# Patient Record
Sex: Female | Born: 1980 | Race: Black or African American | Hispanic: No | Marital: Single | Smoking: Never smoker
Health system: Southern US, Community
[De-identification: ages and names within clinical notes are randomized; demographics above are authoritative.]

---

## 2018-02-05 ENCOUNTER — Encounter (HOSPITAL_BASED_OUTPATIENT_CLINIC_OR_DEPARTMENT_OTHER): Payer: Self-pay | Admitting: *Deleted

## 2018-02-05 ENCOUNTER — Emergency Department (HOSPITAL_BASED_OUTPATIENT_CLINIC_OR_DEPARTMENT_OTHER): Payer: BLUE CROSS/BLUE SHIELD

## 2018-02-05 ENCOUNTER — Emergency Department (HOSPITAL_BASED_OUTPATIENT_CLINIC_OR_DEPARTMENT_OTHER)
Admission: EM | Admit: 2018-02-05 | Discharge: 2018-02-05 | Disposition: A | Discharge status: Home / Self Care | Payer: BLUE CROSS/BLUE SHIELD | Attending: Emergency Medicine | Admitting: Emergency Medicine

## 2018-02-05 ENCOUNTER — Other Ambulatory Visit: Payer: Self-pay

## 2018-02-05 DIAGNOSIS — N83291 Other ovarian cyst, right side: Secondary | ICD-10-CM | POA: Diagnosis not present

## 2018-02-05 DIAGNOSIS — N83209 Unspecified ovarian cyst, unspecified side: Secondary | ICD-10-CM

## 2018-02-05 DIAGNOSIS — R1030 Lower abdominal pain, unspecified: Secondary | ICD-10-CM | POA: Diagnosis present

## 2018-02-05 LAB — CBC WITH DIFFERENTIAL/PLATELET
ABS IMMATURE GRANULOCYTES: 0.01 10*3/uL (ref 0.00–0.07)
BASOS ABS: 0 10*3/uL (ref 0.0–0.1)
BASOS PCT: 0 %
Eosinophils Absolute: 0 10*3/uL (ref 0.0–0.5)
Eosinophils Relative: 1 %
HCT: 38 % (ref 36.0–46.0)
HEMOGLOBIN: 12.5 g/dL (ref 12.0–15.0)
Immature Granulocytes: 0 %
LYMPHS PCT: 31 %
Lymphs Abs: 1.5 10*3/uL (ref 0.7–4.0)
MCH: 30.6 pg (ref 26.0–34.0)
MCHC: 32.9 g/dL (ref 30.0–36.0)
MCV: 93.1 fL (ref 80.0–100.0)
MONO ABS: 0.3 10*3/uL (ref 0.1–1.0)
Monocytes Relative: 6 %
NEUTROS ABS: 2.9 10*3/uL (ref 1.7–7.7)
NRBC: 0 % (ref 0.0–0.2)
Neutrophils Relative %: 62 %
PLATELETS: 254 10*3/uL (ref 150–400)
RBC: 4.08 MIL/uL (ref 3.87–5.11)
RDW: 12.4 % (ref 11.5–15.5)
WBC: 4.8 10*3/uL (ref 4.0–10.5)

## 2018-02-05 LAB — COMPREHENSIVE METABOLIC PANEL
ALBUMIN: 4.2 g/dL (ref 3.5–5.0)
ALK PHOS: 66 U/L (ref 38–126)
ALT: 21 U/L (ref 0–44)
ANION GAP: 9 (ref 5–15)
AST: 20 U/L (ref 15–41)
BUN: 10 mg/dL (ref 6–20)
CALCIUM: 9 mg/dL (ref 8.9–10.3)
CHLORIDE: 102 mmol/L (ref 98–111)
CO2: 26 mmol/L (ref 22–32)
Creatinine, Ser: 0.67 mg/dL (ref 0.44–1.00)
GFR calc non Af Amer: >60 mL/min (ref >60)
GLUCOSE: 93 mg/dL (ref 70–99)
Potassium: 4 mmol/L (ref 3.5–5.1)
SODIUM: 137 mmol/L (ref 135–145)
Total Bilirubin: 0.9 mg/dL (ref 0.3–1.2)
Total Protein: 7.7 g/dL (ref 6.5–8.1)

## 2018-02-05 LAB — URINALYSIS, ROUTINE W REFLEX MICROSCOPIC
BILIRUBIN URINE: NEGATIVE
GLUCOSE, UA: NEGATIVE mg/dL
HGB URINE DIPSTICK: NEGATIVE
KETONES UR: NEGATIVE mg/dL
Nitrite: NEGATIVE
PH: 5.5 (ref 5.0–8.0)
Protein, ur: NEGATIVE mg/dL

## 2018-02-05 LAB — URINALYSIS, MICROSCOPIC (REFLEX)

## 2018-02-05 LAB — PREGNANCY, URINE: Preg Test, Ur: NEGATIVE

## 2018-02-05 MED ORDER — ONDANSETRON HCL 4 MG PO TABS: 4.0000 mg | ORAL_TABLET | Freq: Four times a day (QID) | ORAL | 0 refills | Status: AC

## 2018-02-05 MED ORDER — OXYCODONE-ACETAMINOPHEN 5-325 MG PO TABS: 1.0000 | ORAL_TABLET | ORAL | 0 refills | Status: AC | PRN

## 2018-02-05 MED ORDER — IOPAMIDOL (ISOVUE-300) INJECTION 61%
100.0000 mL | Freq: Once | INTRAVENOUS | Status: AC | PRN
  Administered 2018-02-05: 100 mL via INTRAVENOUS

## 2018-02-05 MED ORDER — MORPHINE SULFATE (PF) 4 MG/ML IV SOLN
4.0000 mg | Freq: Once | INTRAVENOUS | Status: AC
  Administered 2018-02-05: 4 mg via INTRAVENOUS
  Filled 2018-02-05: qty 1

## 2018-02-05 MED ORDER — IOPAMIDOL (ISOVUE-300) INJECTION 61%
30.0000 mL | Freq: Once | INTRAVENOUS | Status: AC | PRN
  Administered 2018-02-05: 15 mL via ORAL

## 2018-02-05 NOTE — ED Provider Notes (Signed)
[facility] EMERGENCY DEPARTMENT Provider Note   CSN: [id number] Arrival date & time: 02/05/18  4098     History   Chief Complaint Chief Complaint  Patient presents with  . Abdominal Pain    HPI Kara Garrett is a 37 y.o. female.  The history is provided by the patient and medical records.  Abdominal Pain   This is a new problem. The current episode started more than 2 days ago. The problem occurs daily. The problem has been gradually improving. The pain is associated with an unknown factor. The pain is located in the RLQ, LLQ, rectum and suprapubic region. The quality of the pain is sharp and aching. The pain is severe. Pertinent negatives include fever, belching, diarrhea, melena, nausea, vomiting, constipation and dysuria. The symptoms are aggravated by palpation.    History reviewed. No pertinent past medical history.  There are no active problems to display for this patient.   History reviewed. No pertinent surgical history.   OB History   None      Home Medications    Prior to Admission medications   Not on File    Family History History reviewed. No pertinent family history.  Social History Social History   Tobacco Use  . Smoking status: Never Smoker  . Smokeless tobacco: Never Used  Substance Use Topics  . Alcohol use: Not on file  . Drug use: Not on file     Allergies   Patient has no known allergies.   Review of Systems Review of Systems  Constitutional: Negative for chills, diaphoresis, fatigue and fever.  HENT: Negative for congestion.   Respiratory: Negative for cough, chest tightness and shortness of breath.   Gastrointestinal: Positive for abdominal pain and rectal pain. Negative for abdominal distention, constipation, diarrhea, melena, nausea and vomiting.  Genitourinary: Negative for decreased urine volume, dysuria, flank pain, pelvic pain, vaginal bleeding, vaginal discharge and vaginal pain.  Musculoskeletal:  Negative for back pain, neck pain and neck stiffness.  Skin: Negative for rash and wound.  Psychiatric/Behavioral: Negative for agitation.     Physical Exam Updated Vital Signs BP (!) 108/53 (BP Location: Left Arm)   Pulse 72   Temp 98.5 F (36.9 C) (Oral)   Resp 18   LMP 11/05/2017   SpO2 100%   Physical Exam  Constitutional: She appears well-developed and well-nourished.  Non-toxic appearance. She does not appear ill. No distress.  HENT:  Head: Normocephalic and atraumatic.  Eyes: Conjunctivae are normal.  Neck: Neck supple.  Cardiovascular: Normal rate and regular rhythm.  No murmur heard. Pulmonary/Chest: Effort normal and breath sounds normal. No respiratory distress.  Abdominal: Soft. Normal appearance and bowel sounds are normal. There is tenderness in the right lower quadrant, suprapubic area and left lower quadrant. There is no rigidity, no rebound, no guarding and no CVA tenderness.  Genitourinary: Rectal exam shows no mass and no tenderness.  Genitourinary Comments: Refused pelvic exam  Musculoskeletal: She exhibits no edema.  Neurological: She is alert.  Skin: Skin is warm and dry. Capillary refill takes less than 2 seconds. She is not diaphoretic.  Psychiatric: She has a normal mood and affect.  Nursing note and vitals reviewed.    ED Treatments / Results  Labs (all labs ordered are listed, but only abnormal results are displayed) Labs Reviewed  URINALYSIS, ROUTINE W REFLEX MICROSCOPIC - Abnormal; Notable for the following components:      Result Value   Specific Gravity, Urine >1.030 (*)    Leukocytes,  UA TRACE (*)    All other components within normal limits  URINALYSIS, MICROSCOPIC (REFLEX) - Abnormal; Notable for the following components:   Bacteria, UA FEW (*)    All other components within normal limits  PREGNANCY, URINE  CBC WITH DIFFERENTIAL/PLATELET  COMPREHENSIVE METABOLIC PANEL    EKG None  Radiology Ct Abdomen Pelvis W  Contrast  Result Date: 02/05/2018 CLINICAL DATA:  Lower abdomen pain, particularly within the right lower quadrant, also some rectal pain EXAM: CT ABDOMEN AND PELVIS WITH CONTRAST TECHNIQUE: Multidetector CT imaging of the abdomen and pelvis was performed using the standard protocol following bolus administration of intravenous contrast. CONTRAST:  ISOVUE-300 IOPAMIDOL (ISOVUE-300) INJECTION 61%, 15mL ISOVUE-300 IOPAMIDOL (ISOVUE-300) INJECTION 61% COMPARISON:  None. FINDINGS: Lower chest: The lung bases are clear. The heart is within normal limits in size. Hepatobiliary: The liver enhances with no focal abnormality and no ductal dilatation is seen. The gallbladder is visualized and no calcified gallstones are seen. Pancreas: The pancreas is normal in size and the pancreatic duct is not dilated. Spleen: The spleen is unremarkable. Adrenals/Urinary Tract: The adrenal glands appear normal. The kidneys enhance with no abnormality and no renal calculi are seen. No hydronephrosis is noted. The ureters appear normal in caliber. The urinary bladder is moderately well distended with no abnormality evident. Stomach/Bowel: The stomach is distended with oral contrast and retained food debris. No small bowel distention is seen and no edema is noted. There is a moderate amount of feces throughout the entire colon. No distention of the colon is seen. The terminal ileum and the appendix are well visualized with no inflammatory process noted. Vascular/Lymphatic: The abdominal aorta is normal in caliber. No adenopathy is seen. Reproductive: The uterus is normal in size. However there appear to be ovarian cysts bilaterally the largest on the left posteriorly adjacent to the rectum measuring 3.6 x 2.7 cm with some peripheral enhancement. Also there is a small amount of free fluid within the pelvis. This may indicate a recently ruptured ovarian cyst. Prominent adnexal vascularity also is present of questionable significance  but which can be seen with pelvic congestion syndrome. Other: No abdominal wall hernia is seen. Musculoskeletal: The lumbar vertebrae are in normal alignment with normal intervertebral disc spaces. The SI joints appear well corticated. IMPRESSION: 1. There are ovarian cysts left larger than right with the left ovarian cyst posteriorly positioned adjacent to the rectum with some peripheral enhancement and a small amount of free fluid. This could indicate recent rupture of an ovarian cyst. 2. The appendix and terminal ileum are unremarkable. 3. With somewhat prominent pelvic vascularity pelvic congestion syndrome cannot be excluded. Electronically Signed   By: Dwyane Dee M.D.   On: 02/05/2018 11:15    Procedures Procedures (including critical care time)  Medications Ordered in ED Medications  morphine 4 MG/ML injection 4 mg (4 mg Intravenous Given 02/05/18 1002)  iopamidol (ISOVUE-300) 61 % injection 30 mL (15 mLs Oral Contrast Given 02/05/18 0930)  iopamidol (ISOVUE-300) 61 % injection 100 mL (100 mLs Intravenous Contrast Given 02/05/18 1047)     Initial Impression / Assessment and Plan / ED Course  I have reviewed the triage vital signs and the nursing notes.  Pertinent labs & imaging results that were available during my care of the patient were reviewed by me and considered in my medical decision making (see chart for details).     Kara Garrett is a 37 y.o. female with a past medical history significant for ovarian cysts  who presents with abdominal pain and rectal pain.  Patient reports that for the last 3 days she has had intermittent pain in her lower abdomen and her rectum.  She reports having normal bowel movement this morning.  She denies any rectal bleeding or history of hemorrhoids.  She denies any recent trauma.  She denies any urinary symptoms.  She reports no nausea vomiting or diarrhea.  She reports has not had a menstrual cycle since July and is unsure if she is pregnant.  She  denies any vaginal discharge or vaginal bleeding.  No recent pelvic trauma.  Next  She describes her pain as moderate to severe earlier and is slightly improving.  On exam, patient had lower abdominal tenderness in her right lower quadrant and suprapubic area.  No CVA tenderness or low back tenderness.  Lungs clear.  Chest nontender.  Normal bowel sounds.  Rectal exam was performed with no external or internal hemorrhoids seen or palpated.  No rectal bleeding.  Due to the right lower quadrant abdominal tenderness on exam, CT was ordered to rule out appendicitis.  Patient other screen laboratory testing as well.  Labs reassuring aside from urinalysis showing leukocytes and bacteria.  Given the lack of dysuria, will not treat patient for UTI.  Culture will be sent.  CT scan revealed evidence of a ruptured ovarian cyst on the left that is adjacent to the rectum with inflammation.  Suspect this is the etiology of her discomfort.  After the discovery, patient was offered pelvic exam and pelvic ultrasound to rule out torsion however as patient's pain was improving she would rather follow-up with her OB/GYN and PCP for this.  Patient will be given prescription for pain medicine and nausea medicine and will follow up.  Patient understood return precautions for any new or worsened symptoms.  Patient no other questions or concerns and was discharged in good condition with improving symptoms.      Final Clinical Impressions(s) / ED Diagnoses   Final diagnoses:  Ovarian cyst rupture  Lower abdominal pain    ED Discharge Orders         Ordered    oxyCODONE-acetaminophen (PERCOCET/ROXICET) 5-325 MG tablet  Every 4 hours PRN     02/05/18 1159    ondansetron (ZOFRAN) 4 MG tablet  Every 6 hours     02/05/18 1159          Clinical Impression: 1. Ovarian cyst rupture   2. Lower abdominal pain     Disposition: Discharge  Condition: Good  I have discussed the results, Dx and Tx plan with the  pt(& family if present). He/she/they expressed understanding and agree(s) with the plan. Discharge instructions discussed at great length. Strict return precautions discussed and pt &/or family have verbalized understanding of the instructions. No further questions at time of discharge.    New Prescriptions   ONDANSETRON (ZOFRAN) 4 MG TABLET    Take 1 tablet (4 mg total) by mouth every 6 (six) hours.   OXYCODONE-ACETAMINOPHEN (PERCOCET/ROXICET) 5-325 MG TABLET    Take 1 tablet by mouth every 4 (four) hours as needed.    Follow Up: [facility] [address] [place] [address] [phone] Schedule an appointment as soon as possible for a visit    [facility] [address] [place] [address] [phone] Schedule an appointment as soon as possible for a visit       Steadman Prosperi, Canary Brim, MD 02/05/18 1213

## 2018-02-05 NOTE — Discharge Instructions (Signed)
Your imaging today showed evidence of a ovarian cyst rupture likely contributing to your discomfort.  We discussed the possibility of performing a pelvic exam and a pelvic ultrasound however after our shared decision-making conversation we agreed that you will follow-up with your OB/GYN and PCP for reassessment further management.  Your symptoms are improving and we felt you are safe for discharge home.  Please use the pain medicine nausea medicine to help with your discomfort and stay hydrated.  Please rest.  If any symptoms change or worsen, please return to the nearest emergency department.

## 2018-02-05 NOTE — ED Notes (Signed)
ED Provider at bedside. 

## 2018-02-05 NOTE — ED Triage Notes (Signed)
Pt reports lower abd pain radiating right to left and through to her rectum x Sunday. Pt states her lmp was in July, states she is not pregnant and "i'm not sure why I haven't had my cycle since July." denies any discharge or spotting.

## 2018-02-05 NOTE — ED Notes (Signed)
Pt c/o rectal pain. Had a hard  BM this morning, no rectal bleeding noted per patient.

## 2018-07-23 ENCOUNTER — Emergency Department (HOSPITAL_BASED_OUTPATIENT_CLINIC_OR_DEPARTMENT_OTHER)
Admission: EM | Admit: 2018-07-23 | Discharge: 2018-07-23 | Disposition: A | Discharge status: Home / Self Care | Payer: BLUE CROSS/BLUE SHIELD | Attending: Emergency Medicine | Admitting: Emergency Medicine

## 2018-07-23 ENCOUNTER — Other Ambulatory Visit: Payer: Self-pay

## 2018-07-23 ENCOUNTER — Encounter (HOSPITAL_BASED_OUTPATIENT_CLINIC_OR_DEPARTMENT_OTHER): Payer: Self-pay | Admitting: Emergency Medicine

## 2018-07-23 DIAGNOSIS — O021 Missed abortion: Secondary | ICD-10-CM | POA: Diagnosis present

## 2018-07-23 DIAGNOSIS — Z3A08 8 weeks gestation of pregnancy: Secondary | ICD-10-CM | POA: Diagnosis not present

## 2018-07-23 NOTE — ED Provider Notes (Signed)
[facility] EMERGENCY DEPARTMENT Provider Note   CSN: [id number] Arrival date & time: 07/23/18  0857    History   Chief Complaint Chief Complaint  Patient presents with  . Possible Pregnancy    HPI Kara Garrett is a 38 y.o. female.     38 year old female with past medical history below who presents with missed abortion.  The patient was evaluated by OB/GYN on 4/3 and was diagnosed with a missed abortion, ultrasound showed 8-week intrauterine pregnancy with no fetal heart tones and no color flow consistent with MAB.  She was given a prescription for Cytotec but states that she did not take this medication because she is never heard of putting a medication in her vagina.  She went to a [facility] yesterday and had labs and was told to f/u. She reports no vaginal bleeding or discharge and no cramping. She has not contacted OBGYN.  The history is provided by the patient.  Possible Pregnancy     History reviewed. No pertinent past medical history.  There are no active problems to display for this patient.   History reviewed. No pertinent surgical history.   OB History    Gravida  1   Para      Term      Preterm      AB      Living        SAB      TAB      Ectopic      Multiple      Live Births               Home Medications    Prior to Admission medications   Medication Sig Start Date End Date Taking? Authorizing Provider  ondansetron (ZOFRAN) 4 MG tablet Take 1 tablet (4 mg total) by mouth every 6 (six) hours. 02/05/18   Tegeler, Canary Brimhristopher J, MD  oxyCODONE-acetaminophen (PERCOCET/ROXICET) 5-325 MG tablet Take 1 tablet by mouth every 4 (four) hours as needed. 02/05/18   Tegeler, Canary Brimhristopher J, MD    Family History History reviewed. No pertinent family history.  Social History Social History   Tobacco Use  . Smoking status: Never Smoker  . Smokeless tobacco: Never Used  Substance Use Topics  . Alcohol use: Not on file  . Drug  use: Not on file     Allergies   Patient has no known allergies.   Review of Systems Review of Systems All other systems reviewed and are negative except that which was mentioned in HPI   Physical Exam Updated Vital Signs BP 115/71 (BP Location: Right Arm)   Pulse 72   Temp 98.9 F (37.2 C) (Oral)   Resp 16   Ht 5\' 3"  (1.6 m)   Wt 84.4 kg   LMP 04/11/2018   SpO2 100%   BMI 32.95 kg/m   Physical Exam Vitals signs and nursing note reviewed.  Constitutional:      General: She is not in acute distress.    Appearance: She is well-developed.     Comments: Texting on phone, comfortable  HENT:     Head: Normocephalic and atraumatic.  Eyes:     Conjunctiva/sclera: Conjunctivae normal.  Neck:     Musculoskeletal: Neck supple.  Pulmonary:     Effort: Pulmonary effort is normal.  Skin:    General: Skin is warm and dry.  Neurological:     Mental Status: She is alert and oriented to person, place, and time.  Psychiatric:  Judgment: Judgment normal.      ED Treatments / Results  Labs (all labs ordered are listed, but only abnormal results are displayed) Labs Reviewed - No data to display  EKG None  Radiology No results found.  Procedures Procedures (including critical care time)  Medications Ordered in ED Medications - No data to display   Initial Impression / Assessment and Plan / ED Course  I have reviewed the triage vital signs and the nursing notes.         I reviewed chart including OBGYN telemedicine visit on 4/3. She has clear [place] documentation of missed abortion. She has not followed OBGYN instructions to use cytotec, stating she had never heard of a vaginal medication. I explained that this is very frequently used by OBGYNs particularly on L&D service.   I called [facility] pharmacy and confirmed they have the prescription there. Informed pt of this. She inquired "why can't I have a D&C?" I explained that due to current pandemic, all  medical facilities are trying to avoid unnecessary procedures and in-person visits to limit exposures to COVID-19. Furthermore, I explained that medical therapy for MAB is a 1st line therapy and D&C carries more risk. Instructed to obtain medication and take once today, again in 48 hours if no cramping or bleeding by then. Instructed to f/u with OBGYN. PT voiced understanding of instructions.  Final Clinical Impressions(s) / ED Diagnoses   Final diagnoses:  Missed abortion    ED Discharge Orders    None       [place], Ambrose Finland, MD 07/23/18 [phone]

## 2018-07-23 NOTE — ED Notes (Signed)
ED Provider at bedside. 

## 2018-07-23 NOTE — Discharge Instructions (Addendum)
YOUR PRESCRIPTION WAS SENT TO [facility] ON [address] [facility] [place]. IT IS IMPORTANT FOR YOU TO TAKE THIS MEDICATION AS PRESCRIBED AND FOLLOW UP WITH YOUR OBGYN.

## 2018-07-23 NOTE — ED Notes (Addendum)
Pt verbalized that she is not going to take prescribed medication. Pt states she "does not understand putting medication in my vagina". I asked pt if she would like EDP to come back to explain and pt declined. This RN informed pt to present to pharmacy and speak with the pharmacist before deciding not to use medication.

## 2018-07-23 NOTE — ED Triage Notes (Signed)
Reports to ER for prenatal ultrasound.  States seen at [facility] and stated she was told she needs an ultrasound.  Patient states obgyn cannot see her until April 26.  Reports dates are not coinciding and was told she may be miscarrying.  Denies vaginal bleeding.

## 2019-09-09 IMAGING — CT CT ABD-PELV W/ CM
2 of 4 series · 15 of 46 positions shown, 17 images · IV contrast (iopamidol)
Comparison: None.

CLINICAL DATA: Lower abdomen pain, particularly within the right
lower quadrant, also some rectal pain

EXAM:
CT ABDOMEN AND PELVIS WITH CONTRAST
TECHNIQUE: Multidetector CT imaging of the abdomen and pelvis was performed
using the standard protocol following bolus administration of
intravenous contrast.
CONTRAST:  100mL TWCNKY-Z99 IOPAMIDOL (TWCNKY-Z99) INJECTION 61%,
15mL TWCNKY-Z99 IOPAMIDOL (TWCNKY-Z99) INJECTION 61%

[Series 2: axial st · axial · 0.95mm/px · z∈[-410,-10]mm · 12 of 92 slices shown, 14 images]
[im 8/92  soft-tissue]
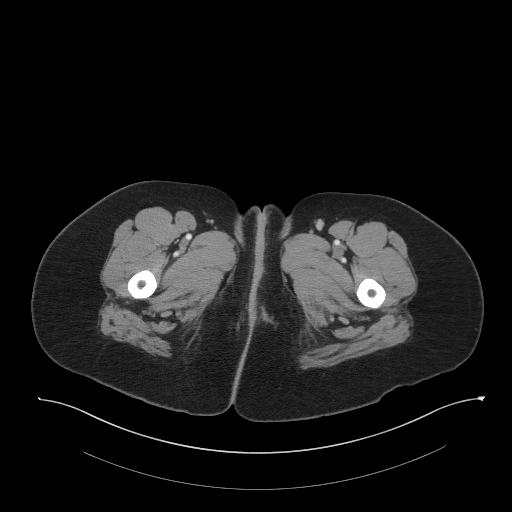
[im 8/92  bone]
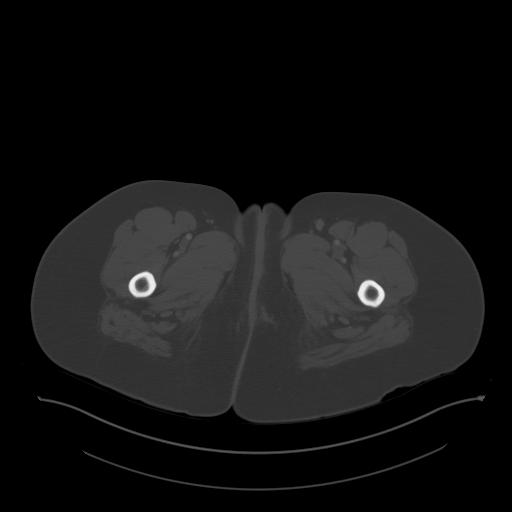
[im 15/92  soft-tissue]
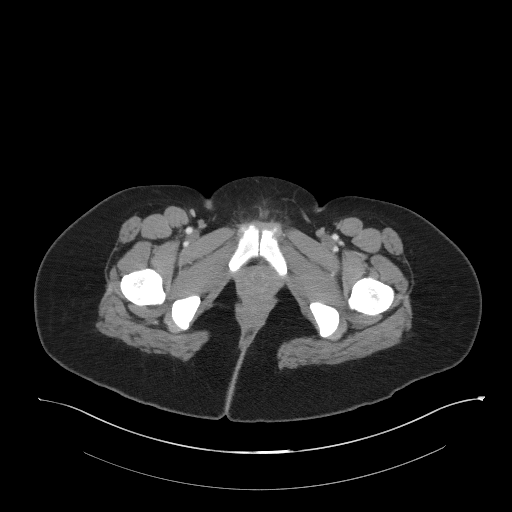
[im 22/92  soft-tissue]
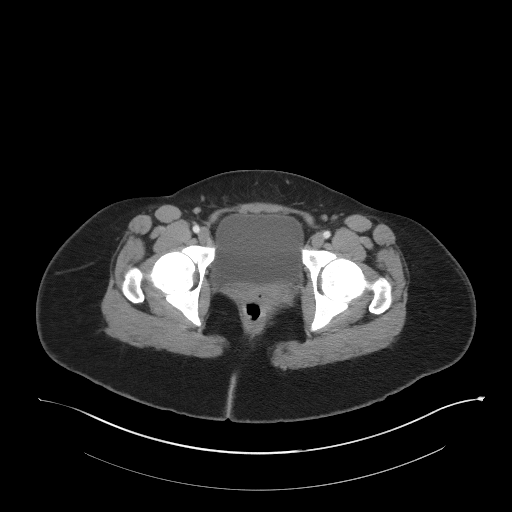
[im 30/92  soft-tissue]
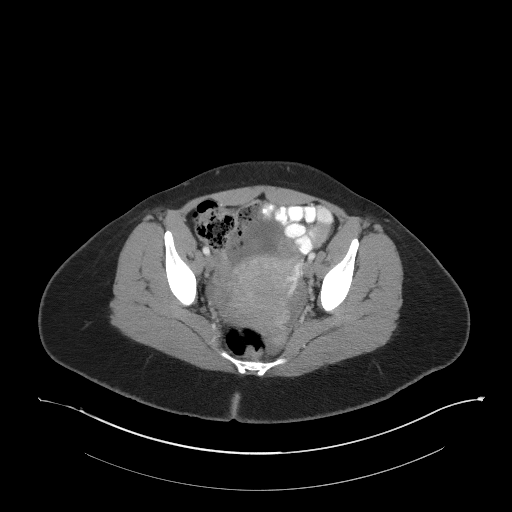
[im 37/92  soft-tissue]
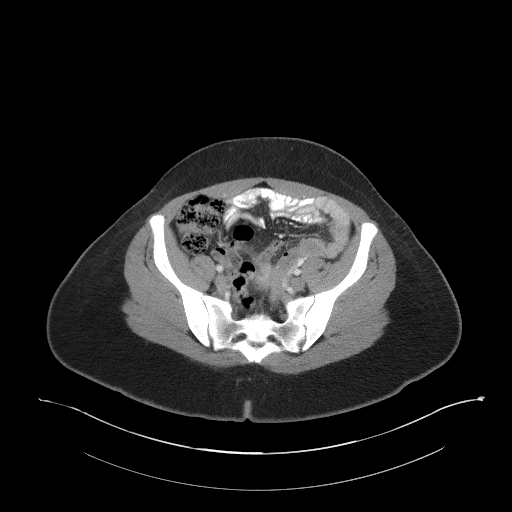
[im 44/92  soft-tissue]
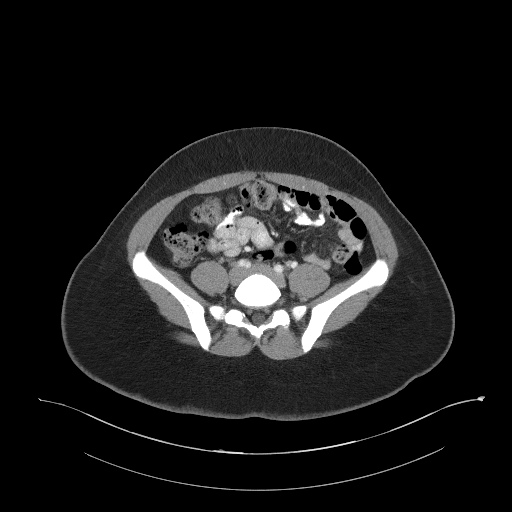
[im 51/92  soft-tissue]
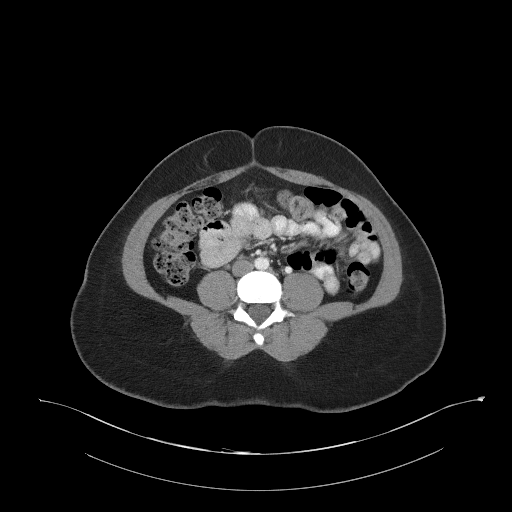
[im 59/92  soft-tissue]
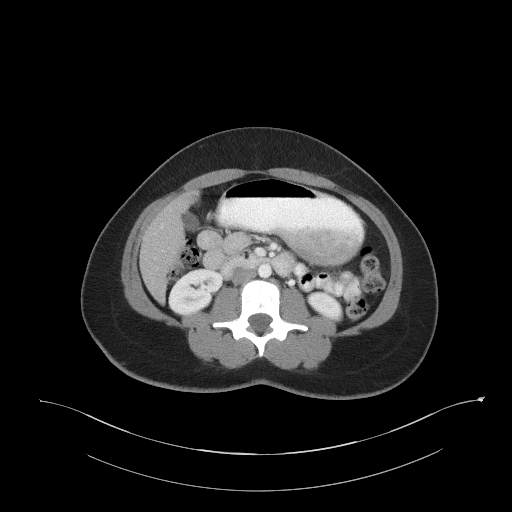
[im 66/92  soft-tissue]
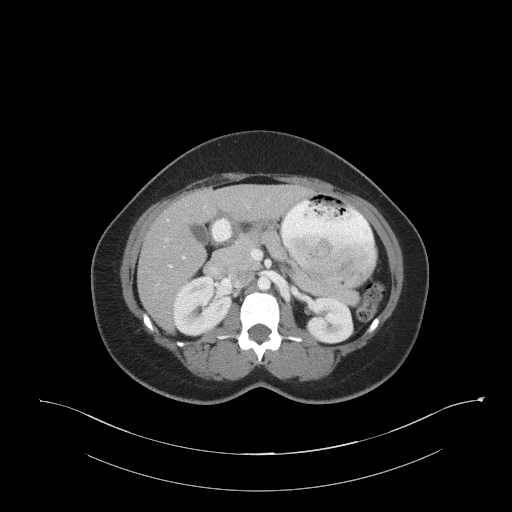
[im 66/92  bone]
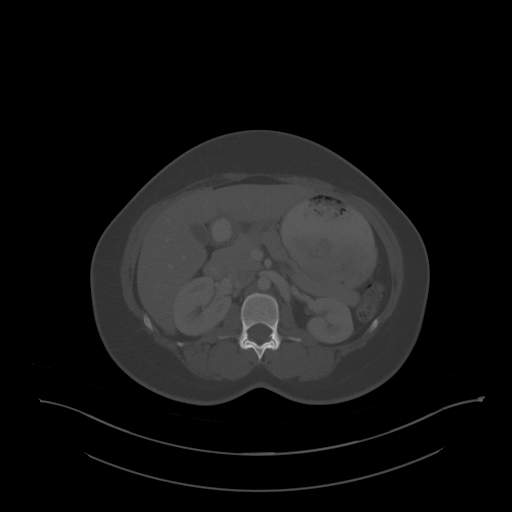
[im 73/92  soft-tissue]
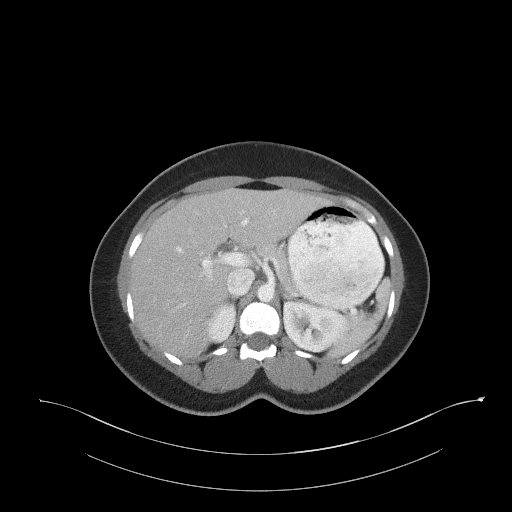
[im 81/92  soft-tissue]
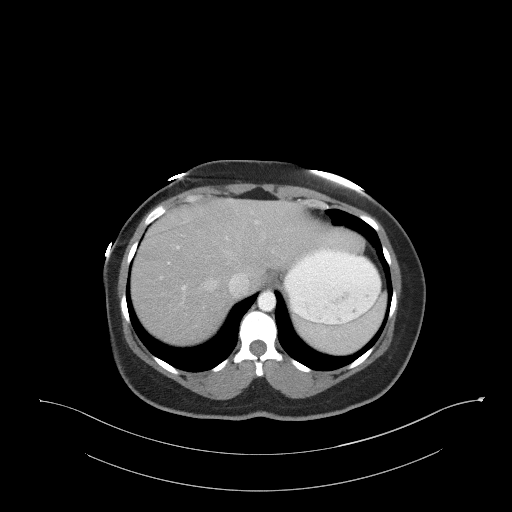
[im 88/92  soft-tissue]
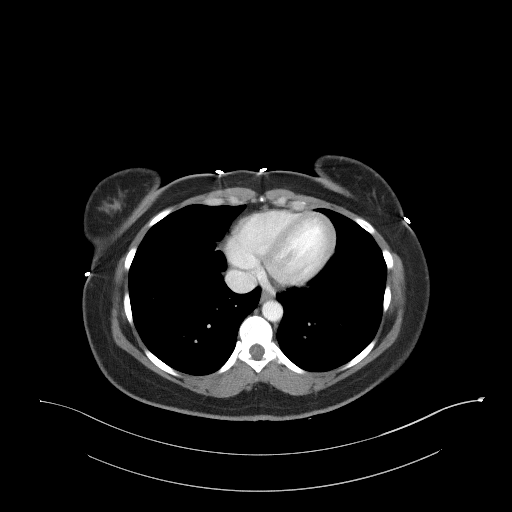

[Series 5: coronal st · coronal · 0.80mm/px · 3 of 95 slices shown]
[im 32/95  soft-tissue]
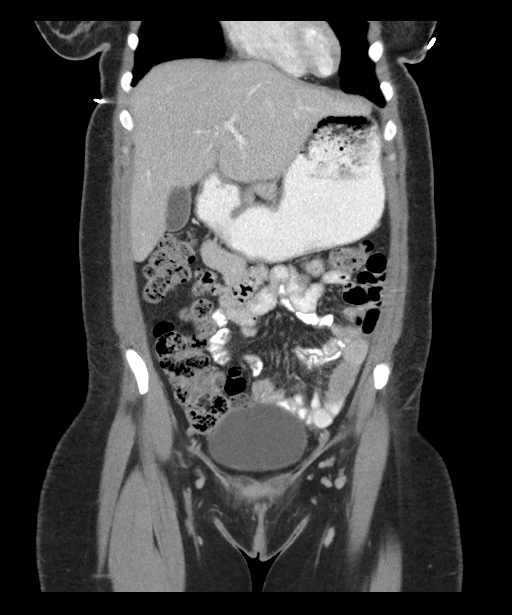
[im 42/95  soft-tissue]
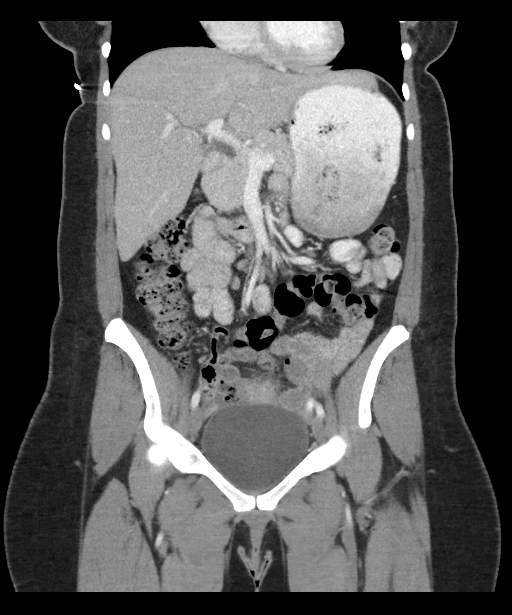
[im 53/95  soft-tissue]
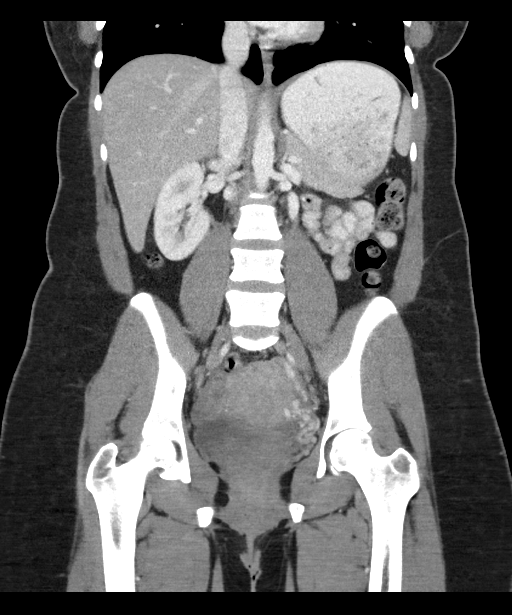

[15 of 46 positions shown; findings below may reference images not displayed]

FINDINGS: Lower chest: The lung bases are clear. The heart is within normal
limits in size.

Hepatobiliary: The liver enhances with no focal abnormality and no
ductal dilatation is seen. The gallbladder is visualized and no
calcified gallstones are seen.

Pancreas: The pancreas is normal in size and the pancreatic duct is
not dilated.

Spleen: The spleen is unremarkable.

Adrenals/Urinary Tract: The adrenal glands appear normal. The
kidneys enhance with no abnormality and no renal calculi are seen.
No hydronephrosis is noted. The ureters appear normal in caliber.
The urinary bladder is moderately well distended with no abnormality
evident.

Stomach/Bowel: The stomach is distended with oral contrast and
retained food debris. No small bowel distention is seen and no edema
is noted. There is a moderate amount of feces throughout the entire
colon. No distention of the colon is seen. The terminal ileum and
the appendix are well visualized with no inflammatory process noted.

Vascular/Lymphatic: The abdominal aorta is normal in caliber. No
adenopathy is seen.

Reproductive: The uterus is normal in size. However there appear to
be ovarian cysts bilaterally the largest on the left posteriorly
adjacent to the rectum measuring 3.6 x 2.7 cm with some peripheral
enhancement. Also there is a small amount of free fluid within the
pelvis. This may indicate a recently ruptured ovarian cyst.
Prominent adnexal vascularity also is present of questionable
significance but which can be seen with pelvic congestion syndrome.

Other: No abdominal wall hernia is seen.

Musculoskeletal: The lumbar vertebrae are in normal alignment with
normal intervertebral disc spaces. The SI joints appear well
corticated.
IMPRESSION: 1. There are ovarian cysts left larger than right with the left
ovarian cyst posteriorly positioned adjacent to the rectum with some
peripheral enhancement and a small amount of free fluid. This could
indicate recent rupture of an ovarian cyst.
2. The appendix and terminal ileum are unremarkable.
3. With somewhat prominent pelvic vascularity pelvic congestion
syndrome cannot be excluded.
